# Patient Record
Sex: Male | Born: 1965 | Race: White | Hispanic: No | Marital: Married | Smoking: Current every day smoker
Health system: Southern US, Community
[De-identification: ages and names within clinical notes are randomized; demographics above are authoritative.]

## PROBLEM LIST (undated history)

## (undated) DIAGNOSIS — E039 Hypothyroidism, unspecified: Secondary | ICD-10-CM

---

## 2004-07-26 ENCOUNTER — Observation Stay (HOSPITAL_COMMUNITY): Admission: EM | Admit: 2004-07-26 | Discharge: 2004-07-26 | Payer: Self-pay | Admitting: Emergency Medicine

## 2009-01-09 ENCOUNTER — Ambulatory Visit: Payer: Self-pay | Admitting: Specialist

## 2009-01-12 ENCOUNTER — Ambulatory Visit: Payer: Self-pay | Admitting: Specialist

## 2020-06-13 ENCOUNTER — Ambulatory Visit
Admission: EM | Admit: 2020-06-13 | Discharge: 2020-06-13 | Disposition: A | Discharge status: Home / Self Care | Payer: BC Managed Care – PPO

## 2020-06-13 ENCOUNTER — Other Ambulatory Visit: Payer: Self-pay

## 2020-06-13 ENCOUNTER — Encounter: Payer: Self-pay | Admitting: Emergency Medicine

## 2020-06-13 DIAGNOSIS — H00014 Hordeolum externum left upper eyelid: Secondary | ICD-10-CM | POA: Diagnosis not present

## 2020-06-13 HISTORY — DX: Hypothyroidism, unspecified: E03.9

## 2020-06-13 MED ORDER — PREDNISOLONE ACETATE 1 % OP SUSP: 1.0000 [drp] | Freq: Four times a day (QID) | OPHTHALMIC | 0 refills | Status: DC

## 2020-06-13 MED ORDER — POLYMYXIN B-TRIMETHOPRIM 10000-0.1 UNIT/ML-% OP SOLN: 1.0000 [drp] | OPHTHALMIC | 0 refills | Status: AC

## 2020-06-13 NOTE — ED Triage Notes (Signed)
No changes in vision.

## 2020-06-13 NOTE — Discharge Instructions (Addendum)
Continue warm compresses at home.  Soak a wash cloth in warm (not scalding) water and place it over the eyes. As the wash cloth cools, it should be rewarmed and replaced for a total of 5 to 10 minutes of soaking time. Warm compresses should be applied two to four times a day as long as the patient has symptoms Perform lid washing: Either warm water or very dilute baby shampoo can be placed on a clean wash cloth, gauze pad, or cotton swab. Then be advised to gently clean along the lashes and lid margin to remove the accumulated material with care to avoid contacting the ocular surface. If shampoo is used, thorough rinsing is recommended. Vigorous washing should be avoided, as it may cause more irritation.  Prescribed ophthalmic antibiotic.  Take as prescribed Follow up with ophthalmology for further evaluation and management if symptoms persists Return or go to ER if you have any new or worsening symptoms such as fever, chills, redness, swelling, eye pain, painful eye movements, vision changes, etc..Marland Kitchen

## 2020-06-13 NOTE — ED Provider Notes (Signed)
[facility]   [id number] 06/13/20 Arrival Time: 6256  Chief Complaint  Patient presents with  . Belepharitis    left     SUBJECTIVE:  Wayne Wells is a 54 y.o. male who presents to the urgent care for complaint of left eye swelling that started last night.  Denies a precipitating event, trauma, or close contacts with similar symptoms.  Has tried OTC eye drops without relief.  Denies alleviating or aggravating factors.  Denies similar symptoms in the past.  Denies fever, chills, nausea, vomiting, eye pain, painful eye movements, halos, discharge, itching, vision changes, double vision, FB sensation, periorbital erythema.     Denies contact lens use.    ROS: As per HPI.  All other pertinent ROS negative.     Past Medical History:  Diagnosis Date  . Hypothyroidism    History reviewed. No pertinent surgical history. No Known Allergies No current facility-administered medications on file prior to encounter.   Current Outpatient Medications on File Prior to Encounter  Medication Sig Dispense Refill  . levothyroxine (SYNTHROID) 50 MCG tablet Take 50 mcg by mouth daily.     Social History   Socioeconomic History  . Marital status: Married    Spouse name: Not on file  . Number of children: Not on file  . Years of education: Not on file  . Highest education level: Not on file  Occupational History  . Not on file  Tobacco Use  . Smoking status: Current Every Day Smoker  . Smokeless tobacco: Never Used  Vaping Use  . Vaping Use: Never used  Substance and Sexual Activity  . Alcohol use: Yes  . Drug use: Never  . Sexual activity: Not on file  Other Topics Concern  . Not on file  Social History Narrative  . Not on file   Social Determinants of Health   Financial Resource Strain:   . Difficulty of Paying Living Expenses:   Food Insecurity:   . Worried About Charity fundraiser in the Last Year:   . Arboriculturist in the Last Year:   Transportation Needs:     . Film/video editor (Medical):   Marland Kitchen Lack of Transportation (Non-Medical):   Physical Activity:   . Days of Exercise per Week:   . Minutes of Exercise per Session:   Stress:   . Feeling of Stress :   Social Connections:   . Frequency of Communication with Friends and Family:   . Frequency of Social Gatherings with Friends and Family:   . Attends Religious Services:   . Active Member of Clubs or Organizations:   . Attends Archivist Meetings:   Marland Kitchen Marital Status:   Intimate Partner Violence:   . Fear of Current or Ex-Partner:   . Emotionally Abused:   Marland Kitchen Physically Abused:   . Sexually Abused:    No family history on file.  OBJECTIVE:    Visual Acuity  Right Eye Distance:   Left Eye Distance:   Bilateral Distance:    Right Eye Near:   Left Eye Near:    Bilateral Near:      Vitals:   06/13/20 1634  BP: 136/88  Pulse: 88  Resp: 16  Temp: 98 F (36.7 C)  TempSrc: Oral  SpO2: 97%  Weight: 175 lb (79.4 kg)    Physical Exam Vitals and nursing note reviewed.  Constitutional:      General: He is not in acute distress.    Appearance:  Normal appearance. He is normal weight. He is not ill-appearing, toxic-appearing or diaphoretic.  Eyes:     General: Lids are normal. Lids are everted, no foreign bodies appreciated. Vision grossly intact. Gaze aligned appropriately. No visual field deficit.       Right eye: No foreign body, discharge or hordeolum.        Left eye: Hordeolum present.No foreign body or discharge.     Extraocular Movements: Extraocular movements intact.  Cardiovascular:     Rate and Rhythm: Normal rate and regular rhythm.     Pulses: Normal pulses.     Heart sounds: Normal heart sounds. No murmur heard.  No friction rub. No gallop.   Pulmonary:     Effort: Pulmonary effort is normal. No respiratory distress.     Breath sounds: Normal breath sounds. No stridor. No wheezing, rhonchi or rales.  Chest:     Chest wall: No tenderness.   Neurological:     Mental Status: He is alert and oriented to person, place, and time.      ASSESSMENT & PLAN:  1. Hordeolum externum of left upper eyelid     Meds ordered this encounter  Medications  . trimethoprim-polymyxin b (POLYTRIM) ophthalmic solution    Sig: Place 1 drop into the left eye every 4 (four) hours for 10 days.    Dispense:  10 mL    Refill:  0  . DISCONTD: prednisoLONE acetate (PRED FORTE) 1 % ophthalmic suspension    Sig: Place 1 drop into the left eye 4 (four) times daily.    Dispense:  5 mL    Refill:  0     Discharge Instructions Continue warm compresses at home.  Soak a wash cloth in warm (not scalding) water and place it over the eyes. As the wash cloth cools, it should be rewarmed and replaced for a total of 5 to 10 minutes of soaking time. Warm compresses should be applied two to four times a day as long as the patient has symptoms Perform lid washing: Either warm water or very dilute baby shampoo can be placed on a clean wash cloth, gauze pad, or cotton swab. Then be advised to gently clean along the lashes and lid margin to remove the accumulated material with care to avoid contacting the ocular surface. If shampoo is used, thorough rinsing is recommended. Vigorous washing should be avoided, as it may cause more irritation.  Prescribed ophthalmic antibiotic.  Take as prescribed Follow up with ophthalmology for further evaluation and management if symptoms persists Return or go to ER if you have any new or worsening symptoms such as fever, chills, redness, swelling, eye pain, painful eye movements, vision changes, etc...  Reviewed expectations re: course of current medical issues. Questions answered. Outlined signs and symptoms indicating need for more acute intervention. Patient verbalized understanding. After Visit Summary given.   Emerson Monte, [place] 06/13/20 [phone]

## 2020-06-13 NOTE — ED Triage Notes (Signed)
Last night left eye started swelling.  Mainly eyelid.

## 2021-01-14 ENCOUNTER — Other Ambulatory Visit: Payer: Self-pay

## 2021-01-14 ENCOUNTER — Encounter: Payer: Self-pay | Admitting: Dermatology

## 2021-01-14 ENCOUNTER — Ambulatory Visit: Payer: BC Managed Care – PPO | Admitting: Dermatology

## 2021-01-14 DIAGNOSIS — L820 Inflamed seborrheic keratosis: Secondary | ICD-10-CM | POA: Diagnosis not present

## 2021-01-14 DIAGNOSIS — D485 Neoplasm of uncertain behavior of skin: Secondary | ICD-10-CM

## 2021-01-14 DIAGNOSIS — L720 Epidermal cyst: Secondary | ICD-10-CM | POA: Diagnosis not present

## 2021-01-14 NOTE — Patient Instructions (Signed)

## 2021-01-20 ENCOUNTER — Encounter: Payer: Self-pay | Admitting: Dermatology

## 2021-01-20 NOTE — Progress Notes (Signed)
   Follow-Up Visit   Subjective  Wayne Wells is a 55 y.o. male who presents for the following: Skin Problem (Mole on scalp x years- seems to be getting rough & cyst on back x 4).  Change in mole on scalp Location:  Duration:  Quality:  Associated Signs/Symptoms: Modifying Factors:  Severity:  Timing: Context: Also on several other lesions examined including cysts.  Objective  Well appearing patient in no apparent distress; mood and affect are within normal limits. Objective  Mid Parietal Scalp: Waxy pink 6 mm papule.     Objective  Left Hip, Right Thigh - Anterior, Right Upper Back: 0.6 to 1.5 cm deep dermal to subcutaneous none inflamed lesions    All skin waist up examined.  Plus legs.   Assessment & Plan    Neoplasm of uncertain behavior of skin Mid Parietal Scalp  Skin / nail biopsy Type of biopsy: tangential   Informed consent: discussed and consent obtained   Timeout: patient name, date of birth, surgical site, and procedure verified   Procedure prep:  Patient was prepped and draped in usual sterile fashion (Non sterile) Prep type:  Chlorhexidine Anesthesia: the lesion was anesthetized in a standard fashion   Anesthetic:  1% lidocaine w/ epinephrine 1-100,000 local infiltration Instrument used: flexible razor blade   Outcome: patient tolerated procedure well   Post-procedure details: wound care instructions given    Destruction of lesion Complexity: simple   Destruction method: electrodesiccation and curettage   Informed consent: discussed and consent obtained   Timeout:  patient name, date of birth, surgical site, and procedure verified Anesthesia: the lesion was anesthetized in a standard fashion   Anesthetic:  1% lidocaine w/ epinephrine 1-100,000 local infiltration Curettage performed in three different directions: Yes   Curettage cycles:  3 Margin per side (cm):  0.1 Final wound size (cm):  0.8 Hemostasis achieved with:  aluminum  chloride Outcome: patient tolerated procedure well with no complications   Post-procedure details: wound care instructions given    Specimen 1 - Surgical pathology Differential Diagnosis: bcc vs scc, wart  Check Margins: No  After shave biopsy base was treated with curettage and cautery  Epidermal cyst (3) Left Hip; Right Thigh - Anterior; Right Upper Back  Benign nature along with possibility of growth and inflammation detailed.  Patient will think over whether he wants to schedule surgical removal.      I, Lavonna Monarch, MD, have reviewed all documentation for this visit.  The documentation on 01/20/21 for the exam, diagnosis, procedures, and orders are all accurate and complete.

## 2021-03-14 ENCOUNTER — Other Ambulatory Visit: Payer: Self-pay

## 2021-03-14 ENCOUNTER — Encounter: Payer: Self-pay | Admitting: Dermatology

## 2021-03-14 ENCOUNTER — Ambulatory Visit (INDEPENDENT_AMBULATORY_CARE_PROVIDER_SITE_OTHER): Payer: BC Managed Care – PPO | Admitting: Dermatology

## 2021-03-14 DIAGNOSIS — B070 Plantar wart: Secondary | ICD-10-CM | POA: Diagnosis not present

## 2021-03-14 DIAGNOSIS — D485 Neoplasm of uncertain behavior of skin: Secondary | ICD-10-CM

## 2021-03-14 DIAGNOSIS — L720 Epidermal cyst: Secondary | ICD-10-CM | POA: Diagnosis not present

## 2021-03-14 NOTE — Patient Instructions (Signed)

## 2021-03-24 ENCOUNTER — Encounter: Payer: Self-pay | Admitting: Dermatology

## 2021-07-01 NOTE — Progress Notes (Signed)
   Follow-Up Visit   Subjective  Wayne Wells is a 55 y.o. male who presents for the following: Cyst (Left hip).  This done left upper hip which patient wants removed.  Also check bottom of foot Location:  Duration:  Quality:  Associated Signs/Symptoms: Modifying Factors:  Severity:  Timing: Context:   Objective  Well appearing patient in no apparent distress; mood and affect are within normal limits. Left 2nd Metatarsal Plantar Area Slightly tender hyperkeratotic 6 mm lesion  Left Hip Plus centimeter none inflamed mobile subcutaneous nodule compatible with epidermoid cyst.  Patient requests removal of the lesion and understands the risks of scar, recurrence, infection, noncoverage by insurance.    A focused examination was performed including torso, legs, feet.. Relevant physical exam findings are noted in the Assessment and Plan.   Assessment & Plan    Plantar wart Left 2nd Metatarsal Plantar Area  Pare & in office [facility].  Explained the primary goal of treatment is to relieve/prevent pain.  Epidermoid cyst Left Hip  Skin excision - Left Hip  Lesion length (cm):  3 Lesion width (cm):  0.8 Margin per side (cm):  0 Total excision diameter (cm):  3 Informed consent: discussed and consent obtained   Timeout: patient name, date of birth, surgical site, and procedure verified   Anesthesia: the lesion was anesthetized in a standard fashion   Anesthetic:  1% lidocaine w/ epinephrine 1-100,000 local infiltration Instrument used: #15 blade   Hemostasis achieved with: pressure and electrodesiccation   Outcome: patient tolerated procedure well with no complications   Post-procedure details: sterile dressing applied and wound care instructions given   Dressing type: bandage, petrolatum and pressure dressing   Additional details:  3-0 vicryl x 3 4-0 Ethilon x 7  Specimen 1 - Surgical pathology Differential Diagnosis: r/o cyst  Check Margins: No      I, Lavonna Monarch, MD, have reviewed all documentation for this visit.  The documentation on 07/01/21 for the exam, diagnosis, procedures, and orders are all accurate and complete.

## 2022-01-06 ENCOUNTER — Ambulatory Visit
Admission: EM | Admit: 2022-01-06 | Discharge: 2022-01-06 | Disposition: A | Discharge status: Home / Self Care | Payer: BC Managed Care – PPO | Attending: Urgent Care | Admitting: Urgent Care

## 2022-01-06 ENCOUNTER — Emergency Department (HOSPITAL_COMMUNITY)
Admission: EM | Admit: 2022-01-06 | Discharge: 2022-01-06 | Disposition: A | Discharge status: Home / Self Care | Payer: BC Managed Care – PPO | Attending: Emergency Medicine | Admitting: Emergency Medicine

## 2022-01-06 ENCOUNTER — Emergency Department (HOSPITAL_COMMUNITY): Payer: BC Managed Care – PPO

## 2022-01-06 ENCOUNTER — Encounter (HOSPITAL_COMMUNITY): Payer: Self-pay

## 2022-01-06 ENCOUNTER — Other Ambulatory Visit: Payer: Self-pay

## 2022-01-06 DIAGNOSIS — F17200 Nicotine dependence, unspecified, uncomplicated: Secondary | ICD-10-CM

## 2022-01-06 DIAGNOSIS — R079 Chest pain, unspecified: Secondary | ICD-10-CM

## 2022-01-06 DIAGNOSIS — R0781 Pleurodynia: Secondary | ICD-10-CM | POA: Insufficient documentation

## 2022-01-06 LAB — CBC
HCT: 46.5 % (ref 39.0–52.0)
Hemoglobin: 15.7 g/dL (ref 13.0–17.0)
MCH: 30.2 pg (ref 26.0–34.0)
MCHC: 33.8 g/dL (ref 30.0–36.0)
MCV: 89.4 fL (ref 80.0–100.0)
Platelets: 186 10*3/uL (ref 150–400)
RBC: 5.2 MIL/uL (ref 4.22–5.81)
RDW: 12.9 % (ref 11.5–15.5)
WBC: 5.6 10*3/uL (ref 4.0–10.5)
nRBC: 0 % (ref 0.0–0.2)

## 2022-01-06 LAB — BASIC METABOLIC PANEL
Anion gap: 8 (ref 5–15)
BUN: 12 mg/dL (ref 6–20)
CO2: 26 mmol/L (ref 22–32)
Calcium: 9.3 mg/dL (ref 8.9–10.3)
Chloride: 105 mmol/L (ref 98–111)
Creatinine, Ser: 1.07 mg/dL (ref 0.61–1.24)
GFR, Estimated: >60 mL/min (ref >60)
Glucose, Bld: 95 mg/dL (ref 70–99)
Potassium: 4.1 mmol/L (ref 3.5–5.1)
Sodium: 139 mmol/L (ref 135–145)

## 2022-01-06 LAB — D-DIMER, QUANTITATIVE: D-Dimer, Quant: <0.27 ug/mL-FEU (ref 0.00–0.50)

## 2022-01-06 LAB — TROPONIN I (HIGH SENSITIVITY)
Troponin I (High Sensitivity): 4 ng/L (ref <18)
Troponin I (High Sensitivity): 4 ng/L (ref <18)

## 2022-01-06 NOTE — ED Triage Notes (Addendum)
Pt reports left sided chest pain since this morning; tingling finger left hand x 1 week. Chest pain is worse with cough or deep breath. Pt took aspirin 324 mg around 9:30 today.

## 2022-01-06 NOTE — ED Provider Notes (Signed)
[place] EMERGENCY DEPARTMENT Provider Note   CSN: [id number] Arrival date & time: 01/06/22  1104     History  Chief Complaint  Patient presents with   Chest Pain    Wayne Wells is a 56 y.o. male.   Chest Pain  HPI: A 56 year old patient presents for evaluation of chest pain. Initial onset of pain was approximately 3-6 hours ago. The patient's chest pain is sharp and is not worse with exertion. The patient's chest pain is middle- or left-sided, is not well-localized, is not described as heaviness/pressure/tightness and does radiate to the arms/jaw/neck. The patient does not complain of nausea and denies diaphoresis. The patient has smoked in the past 90 days. The patient has no history of stroke, has no history of peripheral artery disease, denies any history of treated diabetes, has no relevant family history of coronary artery disease (first degree relative at less than age 49), is not hypertensive, has no history of hypercholesterolemia and does not have an elevated BMI (>=30).  Pain also increases with inspiration.  Patient was seen at an urgent care care today.  I reviewed the notes of that visit.  He was sent to the ED for further evaluation Home Medications Prior to Admission medications   Medication Sig Start Date End Date Taking? Authorizing Provider  levothyroxine (SYNTHROID) 50 MCG tablet Take 50 mcg by mouth daily. 05/04/20   [provider]      Allergies    Patient has no known allergies.    Review of Systems   Review of Systems  Cardiovascular:  Positive for chest pain.  All other systems reviewed and are negative.  Physical Exam Updated Vital Signs BP (!) 153/87    Pulse 94    Temp 98.1 F (36.7 C) (Oral)    Resp 15    Ht 1.727 m (5\' 8" )    Wt 81.6 kg    SpO2 98%    BMI 27.37 kg/m  Physical Exam Vitals and nursing note reviewed.  Constitutional:      General: He is not in acute distress.    Appearance: He is well-developed.   HENT:     Head: Normocephalic and atraumatic.     Right Ear: External ear normal.     Left Ear: External ear normal.  Eyes:     General: No scleral icterus.       Right eye: No discharge.        Left eye: No discharge.     Conjunctiva/sclera: Conjunctivae normal.  Neck:     Trachea: No tracheal deviation.  Cardiovascular:     Rate and Rhythm: Normal rate and regular rhythm.  Pulmonary:     Effort: Pulmonary effort is normal. No respiratory distress.     Breath sounds: Normal breath sounds. No stridor. No wheezing or rales.  Abdominal:     General: Bowel sounds are normal. There is no distension.     Palpations: Abdomen is soft.     Tenderness: There is no abdominal tenderness. There is no guarding or rebound.  Musculoskeletal:        General: No tenderness or deformity.     Cervical back: Neck supple.  Skin:    General: Skin is warm and dry.     Findings: No rash.  Neurological:     General: No focal deficit present.     Mental Status: He is alert.     Cranial Nerves: No cranial nerve deficit (no facial droop, extraocular  movements intact, no slurred speech).     Sensory: No sensory deficit.     Motor: No abnormal muscle tone or seizure activity.     Coordination: Coordination normal.  Psychiatric:        Mood and Affect: Mood normal.    ED Results / Procedures / Treatments   Labs (all labs ordered are listed, but only abnormal results are displayed) Labs Reviewed  BASIC METABOLIC PANEL  CBC  D-DIMER, QUANTITATIVE  TROPONIN I (HIGH SENSITIVITY)  TROPONIN I (HIGH SENSITIVITY)    EKG EKG Interpretation  Date/Time:  Monday January 06 2022 11:28:26 EST Ventricular Rate:  72 PR Interval:  150 QRS Duration: 90 QT Interval:  382 QTC Calculation: 418 R Axis:   51 Text Interpretation: Normal sinus rhythm Normal ECG When compared with ECG of 06-Jan-2022 10:21, PREVIOUS ECG IS PRESENT No significant change since last tracing Confirmed by Dorie Rank [phone]) on  01/06/2022 12:41:59 PM  Radiology DG Chest 2 View  Result Date: 01/06/2022 CLINICAL DATA:  LEFT side chest pain since this morning, tingling in fingers of LEFT hand for 1 week, worsened chest pain with coughing and deep breathing EXAM: CHEST - 2 VIEW COMPARISON:  None FINDINGS: Normal heart size, mediastinal contours, and pulmonary vascularity. Lungs clear. No pleural effusion or pneumothorax. Bones unremarkable. IMPRESSION: No acute abnormalities. Electronically Signed   By: Lavonia Dana M.D.   On: 01/06/2022 12:12    Procedures Procedures    Medications Ordered in ED Medications - No data to display  ED Course/ Medical Decision Making/ A&P Clinical Course as of 01/06/22 1610  Mon Jan 06, 2022  0601 Basic metabolic panel Metabolic panel normal [JK]  1545 CBC CBC normal [JK]  1545 Troponin I (High Sensitivity) Troponin normal [JK]  1545 D-dimer, quantitative D-dimer negative [JK]  1545 DG Chest 2 View Chest x-ray without acute findings [JK]    Clinical Course User Index [JK] Dorie Rank, MD   HEAR Score: 2                       Medical Decision Making Amount and/or Complexity of Data Reviewed Labs: ordered. Decision-making details documented in ED Course. Radiology: ordered. Decision-making details documented in ED Course.   Low risk heart score.  Symptoms atypical for acute coronary syndrome.  Serial troponins normal.  EKG reassuring.  Patient is having pleuritic discomfort.  D-dimer negative.  Doubt PE.  Do not feel that admission to the hospital for further cardiac evaluation is necessary.  Recommend outpatient follow-up with PCP.  Discussed outpatient stress testing.  Warning signs precautions discussed.        Final Clinical Impression(s) / ED Diagnoses Final diagnoses:  Chest pain, unspecified type    Rx / DC Orders ED Discharge Orders     None         Dorie Rank, MD 01/06/22 1610

## 2022-01-06 NOTE — ED Triage Notes (Signed)
Patient complains of left sided chest pain that he describes as worse with pain and inspiration. Non-radiating and a few days ago experienced tingling in arm

## 2022-01-06 NOTE — ED Provider Notes (Signed)
[place]   MRN: [id number] DOB: 25-Mar-1966  Subjective:   Wayne Wells is a 56 y.o. male presenting for acute onset of left-sided chest pain this morning.  Patient states that he took some aspirin prior to coming in.  Currently the pain is mild to moderate, rated 3-4 out of 10.  Has also had some tingling in his finger of the left hand.  Chest pain is worse with taking a deep breath and coughing.  He is a smoker, does 3/4ppd.  He does have a history of hypothyroidism.  No drug use.  No history of heart disease.  No current facility-administered medications for this encounter.  Current Outpatient Medications:    levothyroxine (SYNTHROID) 50 MCG tablet, Take 50 mcg by mouth daily., Disp: , Rfl:    No Known Allergies  Past Medical History:  Diagnosis Date   Hypothyroidism      History reviewed. No pertinent surgical history.  History reviewed. No pertinent family history.  Social History   Tobacco Use   Smoking status: Every Day   Smokeless tobacco: Never  Vaping Use   Vaping Use: Never used  Substance Use Topics   Alcohol use: Yes   Drug use: Never    ROS   Objective:   Vitals: BP (!) 144/83 (BP Location: Right Arm)    Pulse 62    Temp 97.7 F (36.5 C) (Oral)    Resp 17    SpO2 98%   Physical Exam Constitutional:      General: He is not in acute distress.    Appearance: Normal appearance. He is well-developed. He is not ill-appearing, toxic-appearing or diaphoretic.  HENT:     Head: Normocephalic and atraumatic.     Right Ear: External ear normal.     Left Ear: External ear normal.     Nose: Nose normal.     Mouth/Throat:     Mouth: Mucous membranes are moist.  Eyes:     General: No scleral icterus.       Right eye: No discharge.        Left eye: No discharge.     Extraocular Movements: Extraocular movements intact.  Cardiovascular:     Rate and Rhythm: Normal rate and regular rhythm.     Heart sounds: Normal heart sounds. No murmur  heard.   No friction rub. No gallop.  Pulmonary:     Effort: Pulmonary effort is normal. No respiratory distress.     Breath sounds: Normal breath sounds. No stridor. No wheezing, rhonchi or rales.  Chest:     Chest wall: No tenderness.  Neurological:     Mental Status: He is alert and oriented to person, place, and time.  Psychiatric:        Mood and Affect: Mood normal.        Behavior: Behavior normal.        Thought Content: Thought content normal.   ED ECG REPORT   Date: 01/06/2022  EKG Time: 10:42 AM  Rate: 67bpm  Rhythm: normal sinus rhythm,  there are no previous tracings available for comparison  Axis: normal  Intervals:none  ST&T Change: T wave flattening in lead aVL  Narrative Interpretation: Sinus rhythm at 67 bpm with nonspecific T wave flattening in lead aVL.  No previous EKG available for comparison.   Assessment and Plan :   PDMP not reviewed this encounter.  1. Left-sided chest pain   2. Smoker    I discussed with patient about the  possibility of ACS.  He understands the urgency of needing this worked up and the life-threatening nature of ACS.  Presents with his wife, who contracts for safety and will take him to the hospital now.  As there are no signs of current ACS on his EKG I was agreeable to defer transport to the hospital by EMS.   Jaynee Eagles, PA-C 01/06/22 1051

## 2022-01-06 NOTE — Discharge Instructions (Signed)
Try taking over-the-counter medication such as ibuprofen or Naprosyn over the next few days.  Follow-up with your primary care doctor to discuss further evaluation such as a stress test.  Return to the ED as needed for worsening symptoms.

## 2022-01-06 NOTE — ED Notes (Signed)
Pt took 324 mg aspirin. Pt cp pain was at a 5 but now it is barely felt. Pt had numbness in his right hand. Pt usually have numbness in the left hand.

## 2022-01-06 NOTE — Discharge Instructions (Addendum)
Please present to the hospital now for further testing of your heart to make sure you are not having a heart event.

## 2022-03-06 ENCOUNTER — Emergency Department (HOSPITAL_COMMUNITY): Payer: BC Managed Care – PPO

## 2022-03-06 ENCOUNTER — Emergency Department (HOSPITAL_COMMUNITY)
Admission: EM | Admit: 2022-03-06 | Discharge: 2022-03-06 | Disposition: A | Discharge status: Home / Self Care | Payer: BC Managed Care – PPO | Attending: Emergency Medicine | Admitting: Emergency Medicine

## 2022-03-06 ENCOUNTER — Other Ambulatory Visit: Payer: Self-pay

## 2022-03-06 DIAGNOSIS — E039 Hypothyroidism, unspecified: Secondary | ICD-10-CM | POA: Diagnosis not present

## 2022-03-06 DIAGNOSIS — R0789 Other chest pain: Secondary | ICD-10-CM | POA: Insufficient documentation

## 2022-03-06 DIAGNOSIS — F17200 Nicotine dependence, unspecified, uncomplicated: Secondary | ICD-10-CM | POA: Diagnosis not present

## 2022-03-06 DIAGNOSIS — R079 Chest pain, unspecified: Secondary | ICD-10-CM

## 2022-03-06 DIAGNOSIS — Z79899 Other long term (current) drug therapy: Secondary | ICD-10-CM | POA: Diagnosis not present

## 2022-03-06 DIAGNOSIS — Z7982 Long term (current) use of aspirin: Secondary | ICD-10-CM | POA: Insufficient documentation

## 2022-03-06 LAB — BASIC METABOLIC PANEL
Anion gap: 4 — ABNORMAL LOW (ref 5–15)
BUN: 16 mg/dL (ref 6–20)
CO2: 26 mmol/L (ref 22–32)
Calcium: 9 mg/dL (ref 8.9–10.3)
Chloride: 109 mmol/L (ref 98–111)
Creatinine, Ser: 1.09 mg/dL (ref 0.61–1.24)
GFR, Estimated: >60 mL/min (ref >60)
Glucose, Bld: 105 mg/dL — ABNORMAL HIGH (ref 70–99)
Potassium: 4 mmol/L (ref 3.5–5.1)
Sodium: 139 mmol/L (ref 135–145)

## 2022-03-06 LAB — CBC
HCT: 45.3 % (ref 39.0–52.0)
Hemoglobin: 14.9 g/dL (ref 13.0–17.0)
MCH: 29.9 pg (ref 26.0–34.0)
MCHC: 32.9 g/dL (ref 30.0–36.0)
MCV: 91 fL (ref 80.0–100.0)
Platelets: 176 10*3/uL (ref 150–400)
RBC: 4.98 MIL/uL (ref 4.22–5.81)
RDW: 13.2 % (ref 11.5–15.5)
WBC: 5.4 10*3/uL (ref 4.0–10.5)
nRBC: 0 % (ref 0.0–0.2)

## 2022-03-06 LAB — TROPONIN I (HIGH SENSITIVITY)
Troponin I (High Sensitivity): 5 ng/L (ref <18)
Troponin I (High Sensitivity): 4 ng/L (ref <18)

## 2022-03-06 MED ORDER — CYCLOBENZAPRINE HCL 10 MG PO TABS: 10.0000 mg | ORAL_TABLET | Freq: Two times a day (BID) | ORAL | 0 refills | Status: DC | PRN

## 2022-03-06 MED ORDER — ASPIRIN 81 MG PO CHEW
162.0000 mg | CHEWABLE_TABLET | Freq: Once | ORAL | Status: AC
  Administered 2022-03-06: 162 mg via ORAL
  Filled 2022-03-06: qty 2

## 2022-03-06 NOTE — ED Notes (Signed)
ED Provider at bedside. 

## 2022-03-06 NOTE — Discharge Instructions (Addendum)
You have been evaluated for your chest pain.  Fortunately your labs, EKG, and chest x-ray did not show any concerning finding.  You may take aspirin or Advil as needed for your chest pain.  You may take muscle relaxant prescribed.  Call and follow-up closely with your primary care doctor for further care.  Return if you have any concern ?

## 2022-03-06 NOTE — ED Provider Notes (Signed)
?[facility] ?Provider Note ? ? ?CSN: [id number] ?Arrival date & time: 03/06/22  0356 ? ?  ? ?History ? ?Chief Complaint  ?Patient presents with  ? Chest Pain  ? ? ?Wayne Wells is a 56 y.o. male. ? ?The history is provided by the patient and medical records. No language interpreter was used.  ?Chest Pain ? ?56 year old male significant history of hypothyroidism, presented to ED with complaints of chest pain.  Patient endorses the past 2 weeks he has had intermittent chest pain.  He reports pain is described as an uncomfortable sensation to the right side of his chest as well as having some pain down his right arm.  He noticed more pain when he tries to perform bicep curls but not so much when he perform bench press.  Pain is waxing waning not associated with lightheadedness, dizziness, nausea, shortness of breath.  Since being in the ER waiting to get into the room, his right arm pain has resolved but he still endorsed discomfort in his chest mild in severity.  He admits to occasional tobacco use and social drinker.  He denies any significant cardiac history.  He denies any strong family history of cardiac disease.  Is tried taking Aleve with some improvement.  No prior history of PE DVT no recent surgery prolonged bedrest active cancer or hemoptysis. ? ?Home Medications ?Prior to Admission medications   ?Medication Sig Start Date End Date Taking? Authorizing Provider  ?aspirin EC 81 MG tablet Take 162 mg by mouth once. Swallow whole.   Yes [provider]  ?levothyroxine (SYNTHROID) 50 MCG tablet Take 50 mcg by mouth daily before breakfast. 05/04/20  Yes [provider]  ?naproxen sodium (ALEVE) 220 MG tablet Take 440 mg by mouth daily as needed (pain).   Yes [provider]  ?   ? ?Allergies    ?Patient has no known allergies.   ? ?Review of Systems   ?Review of Systems  ?Cardiovascular:  Positive for chest pain.  ?All other systems reviewed and are  negative. ? ?Physical Exam ?Updated Vital Signs ?BP (!) 146/78 (BP Location: Left Arm)   Pulse 65   Temp 97.6 ?F (36.4 ?C) (Oral)   Resp 18   SpO2 100%  ?Physical Exam ?Vitals and nursing note reviewed.  ?Constitutional:   ?   General: He is not in acute distress. ?   Appearance: He is well-developed.  ?HENT:  ?   Head: Atraumatic.  ?Eyes:  ?   Conjunctiva/sclera: Conjunctivae normal.  ?Cardiovascular:  ?   Rate and Rhythm: Normal rate and regular rhythm.  ?   Pulses: Normal pulses.  ?   Heart sounds: Normal heart sounds.  ?Pulmonary:  ?   Effort: Pulmonary effort is normal.  ?   Breath sounds: Normal breath sounds. No wheezing, rhonchi or rales.  ?Chest:  ?   Chest wall: No tenderness.  ?Abdominal:  ?   Palpations: Abdomen is soft.  ?   Tenderness: There is no abdominal tenderness.  ?Musculoskeletal:  ?   Cervical back: Neck supple.  ?   Right lower leg: No edema.  ?   Left lower leg: No edema.  ?Skin: ?   Findings: No rash.  ?Neurological:  ?   Mental Status: He is alert.  ?Psychiatric:     ?   Mood and Affect: Mood normal.  ? ? ?ED Results / Procedures / Treatments   ?Labs ?(all labs ordered are listed, but only  abnormal results are displayed) ?Labs Reviewed  ?BASIC METABOLIC PANEL - Abnormal; Notable for the following components:  ?    Result Value  ? Glucose, Bld 105 (*)   ? Anion gap 4 (*)   ? All other components within normal limits  ?CBC  ?TROPONIN I (HIGH SENSITIVITY)  ?TROPONIN I (HIGH SENSITIVITY)  ? ? ?EKG ?None ?ED ECG REPORT ? ? Date: 03/06/2022 ? Rate: 52 ? Rhythm: sinus bradycardia ? QRS Axis: normal ? Intervals: normal ? ST/T Wave abnormalities: normal ? Conduction Disutrbances:none ? Narrative Interpretation:  ? Old EKG Reviewed: unchanged ? ?I have personally reviewed the EKG tracing and agree with the computerized printout as noted. ? ? ?Radiology ?DG Chest 2 View ? ?Result Date: 03/06/2022 ?CLINICAL DATA:  56 year old male with intermittent chest pain for 2 weeks. Right chest and shoulder  pain. Mild shortness of breath. Smoker. EXAM: CHEST - 2 VIEW COMPARISON:  Chest radiographs 01/06/2022. FINDINGS: Lung volumes and mediastinal contours remain within normal limits. Visualized tracheal air column is within normal limits. Lung markings are stable and both lungs appear clear (EKG button artifact bilaterally). No pneumothorax or pleural effusion. No acute osseous abnormality identified. Negative visible bowel gas pattern. IMPRESSION: No acute cardiopulmonary abnormality. Electronically Signed   By: Genevie Ann M.D.   On: 03/06/2022 04:32   ? ?Procedures ?Procedures  ? ? ?Medications Ordered in ED ?Medications  ?aspirin chewable tablet 162 mg (162 mg Oral Given 03/06/22 0612)  ? ? ?ED Course/ Medical Decision Making/ A&P ?  ?                        ?Medical Decision Making ? ?BP 135/80   Pulse 65   Temp 97.6 ?F (36.4 ?C) (Oral)   Resp 14   SpO2 100%  ? ?4:38 PM ?This is a 56 year old male presenting complaining of right-sided chest pain and right arm pain which has been intermittently ongoing for the past 2 weeks.  His hear score is 1, low risk of Mace.  No significant shortness of breath, low suspicion for PE.  No significant reproducible chest wall tenderness on exam.  Patient overall well-appearing, vital signs stable.  At this time recommend patient to follow-up with primary care provider for further care.  He may benefit from outpatient cardiac stress test if indicated.  Will discharge home with muscle relaxant as this pain could be musculoskeletal as patient has been working out and perform heavy lifting.  Return precaution given. ? ?This patient presents to the ED for concern of chest pain, this involves an extensive number of treatment options, and is a complaint that carries with it a high risk of complications and morbidity.  The differential diagnosis includes ACS, PE, MSK, PNA, PTX, costochondritis, muscle strain ? ?Co morbidities that complicate the patient evaluation ?hypothyrodism ? Tobacco  use ?Additional history obtained: ? ?Additional history obtained from pt ?External records from outside source obtained and reviewed including prior ER notes ? ?Lab Tests: ? ?I Ordered, and personally interpreted labs.  The pertinent results include:  reassuring labs, negative delta trop ? ?Imaging Studies ordered: ? ?I ordered imaging studies including CXR ?I independently visualized and interpreted imaging which showed no acute finding ?I agree with the radiologist interpretation ? ?Cardiac Monitoring: ? ?The patient was maintained on a cardiac monitor.  I personally viewed and interpreted the cardiac monitored which showed an underlying rhythm of: sinus bradycardia ? ? ? ?Test Considered: ?chest CTA, but low suspicion  for dissection or PE ? ?Critical Interventions: ?ASA ? ? ?Problem List / ED Course: ?chest pain ? ?Reevaluation: ? ?After the interventions noted above, I reevaluated the patient and found that they have :improved ? ?Social Determinants of Health: ?tobacco use ? ?Dispostion: ? ?After consideration of the diagnostic results and the patients response to treatment, I feel that the patent would benefit from outpt f/u. ? ? ? ? ? ? ? ? ?Final Clinical Impression(s) / ED Diagnoses ?Final diagnoses:  ?Nonspecific chest pain  ? ? ?Rx / DC Orders ?ED Discharge Orders   ? ?      Ordered  ?  cyclobenzaprine (FLEXERIL) 10 MG tablet  2 times daily PRN       ? 03/06/22 1645  ? ?  ?  ? ?  ? ? ?  ?Domenic Moras, PA-C ?03/06/22 1648 ? ?  ?Lucrezia Starch, MD ?03/07/22 1621 ? ?

## 2022-03-06 NOTE — ED Triage Notes (Signed)
Pt with intermittent chest pain x 2 weeks in addition to what he thought was tendonitis in R shoulder. This morning was woken out of sleep but pain in R chest with radiation to shoulder that is a different pain than the last 2 weeks. Endorses mild sob.  ?

## 2022-03-06 NOTE — ED Provider Triage Note (Signed)
?  Emergency Medicine Provider Triage Evaluation Note ? ?MRN:  [id number]  ?Arrival date & time: 03/06/22    ?Medically screening exam initiated at 4:12 AM.   ?CC:   ?Chest Pain ?  ?HPI:  ?Wayne Wells is a 56 y.o. year-old male presents to the ED with chief complaint of chest pain for the past few hours.  States that it is right sided and radiates to the arm.  Reports some SOB, which he says might be anxiety.  Took 2 baby aspirin PTA.  Smoking hx. ? ?History provided by patient. ?ROS:  ?-As included in HPI ?PE:  ? ?Vitals:  ? 03/06/22 0412  ?BP: (!) 166/87  ?Pulse: (!) 57  ?Resp: 17  ?Temp: 97.7 ?F (36.5 ?C)  ?SpO2: 96%  ?  ?Non-toxic appearing ?No respiratory distress ?CTAB, no palpable chest wall tenderness ?MDM:  ?Based on signs and symptoms, ACS, PE, chest wall pain is highest on my differential, followed by anxiety. ?I've ordered labs and imaging in triage to expedite lab/diagnostic workup. ? ?Patient was informed that the remainder of the evaluation will be completed by another provider, this initial triage assessment does not replace that evaluation, and the importance of remaining in the ED until their evaluation is complete. ? ?  ?Montine Circle, PA-C ?03/06/22 0414 ? ?

## 2022-03-21 ENCOUNTER — Other Ambulatory Visit: Payer: Self-pay | Admitting: Sports Medicine

## 2022-03-21 DIAGNOSIS — M5020 Other cervical disc displacement, unspecified cervical region: Secondary | ICD-10-CM

## 2022-04-02 ENCOUNTER — Ambulatory Visit
Admission: RE | Admit: 2022-04-02 | Discharge: 2022-04-02 | Disposition: A | Discharge status: Home / Self Care | Payer: BC Managed Care – PPO | Source: Ambulatory Visit | Attending: Sports Medicine | Admitting: Sports Medicine

## 2022-04-02 DIAGNOSIS — M5020 Other cervical disc displacement, unspecified cervical region: Secondary | ICD-10-CM

## 2022-04-02 MED ORDER — TRIAMCINOLONE ACETONIDE 40 MG/ML IJ SUSP (RADIOLOGY)
60.0000 mg | Freq: Once | INTRAMUSCULAR | Status: AC
  Administered 2022-04-02: 60 mg via EPIDURAL

## 2022-04-02 MED ORDER — IOPAMIDOL (ISOVUE-M 300) INJECTION 61%
1.0000 mL | Freq: Once | INTRAMUSCULAR | Status: AC
  Administered 2022-04-02: 1 mL via EPIDURAL

## 2022-04-02 NOTE — Discharge Instructions (Signed)

## 2022-12-08 IMAGING — XA DG INJECT/[PERSON_NAME] INC NEEDLE/CATH/PLC EPI/CERV/THOR W/IMG
2 series · 2 of 2 positions shown · non-contrast
Comparison: none

CLINICAL DATA: Cervical spondylosis without myelopathy. Right-sided
C6-7 disc extrusion with caudal migration. Neck and right shoulder
pain.

[Series 1: ortho standard · 1 of 1 slices shown (1 of 2)]
[im 1/1]
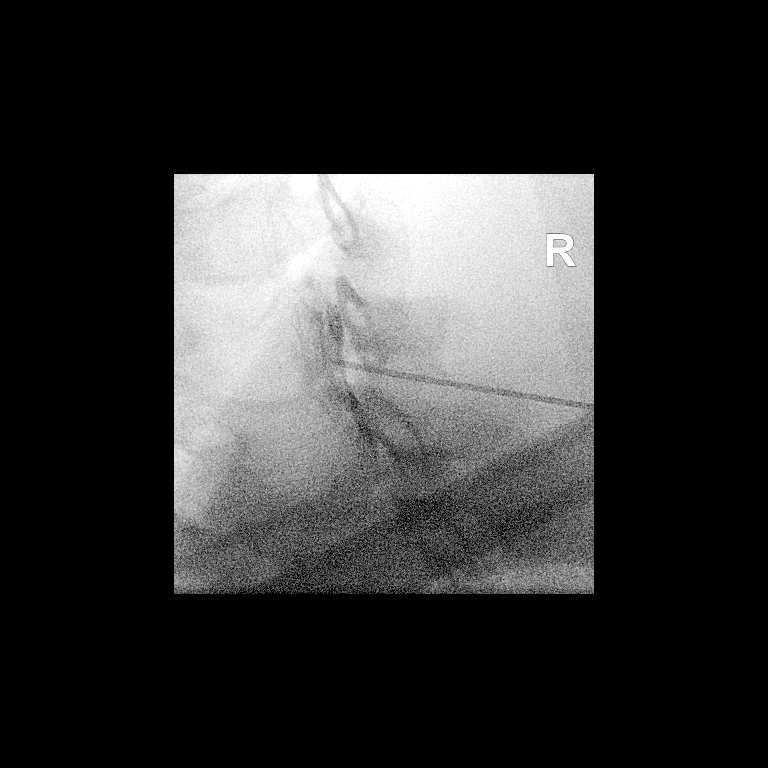

[Series 2: ortho standard · 1 of 1 slices shown (2 of 2)]
[im 1/1]
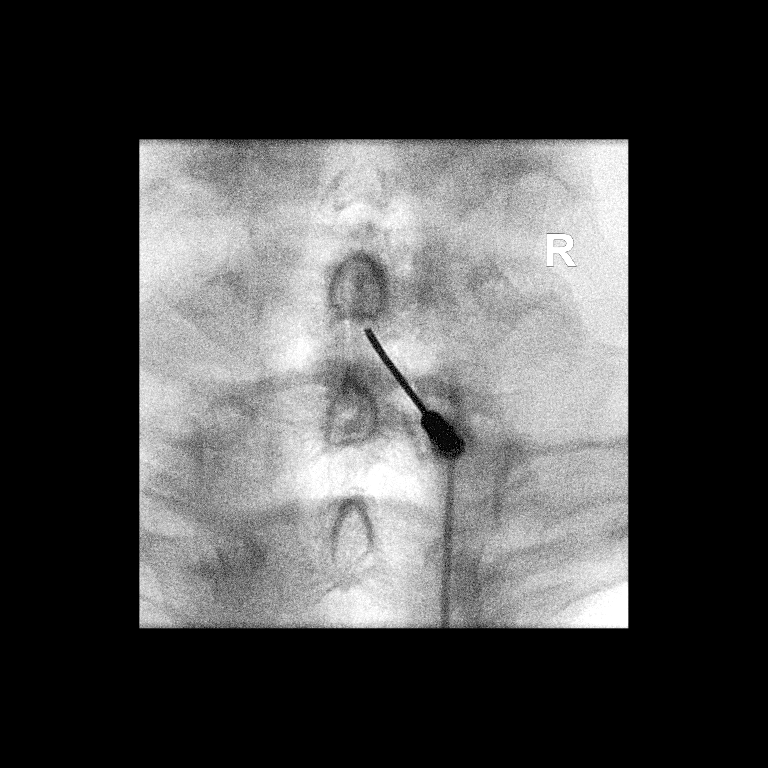

[2 of 2 positions shown; findings below may reference images not displayed]

FLUOROSCOPY:
Radiation Exposure Index (as provided by the fluoroscopic device):
1.20 mGy Kerma

PROCEDURE:
The procedure, risks, benefits, and alternatives were explained to
the patient. Questions regarding the procedure were encouraged and
answered. The patient understands and consents to the procedure.

CERVICAL EPIDURAL INJECTION

An interlaminar approach was performed on the right at C7-T1. A
inch 20 gauge epidural needle was advanced using loss-of-resistance
technique.

DIAGNOSTIC EPIDURAL INJECTION

Injection of Isovue-M 300 shows a good epidural pattern with spread
above and below the level of needle placement, primarily on the
right. No vascular opacification is seen.

THERAPEUTIC EPIDURAL INJECTION

1.5 ml of Kenalog 40 mixed with 2 ml of normal saline were then
instilled. The procedure was well-tolerated, and the patient was
discharged thirty minutes following the injection in good condition.
IMPRESSION: Technically successful interlaminar epidural injection on the right
at C7-T1.

## 2023-05-25 ENCOUNTER — Ambulatory Visit
Admission: EM | Admit: 2023-05-25 | Discharge: 2023-05-25 | Disposition: A | Discharge status: Home / Self Care | Payer: BC Managed Care – PPO | Attending: Nurse Practitioner | Admitting: Nurse Practitioner

## 2023-05-25 DIAGNOSIS — L0291 Cutaneous abscess, unspecified: Secondary | ICD-10-CM

## 2023-05-25 MED ORDER — DOXYCYCLINE HYCLATE 100 MG PO CAPS: 100.0000 mg | ORAL_CAPSULE | Freq: Two times a day (BID) | ORAL | 0 refills | Status: AC

## 2023-05-25 NOTE — ED Triage Notes (Signed)
Pt presents with boil/abscess on back of his neck that he noticed 2 days ago. Pt son drained it last night and now pt is seeing if he needs antibiotics.

## 2023-05-25 NOTE — Discharge Instructions (Signed)
Take doxycycline as prescribed to treat underlying infection Start warm compresses to help dry out infection You can take Tylenol as needed for pain

## 2023-05-25 NOTE — ED Provider Notes (Signed)
[facility]    CSN: [id number] Arrival date & time: 05/25/23  1728      History   Chief Complaint Chief Complaint  Patient presents with   Abscess    HPI Wayne Wells is a 57 y.o. male.   Patient presents today for abscess to the back of his neck that has been present for the past few days.  Reports his son stuck a needle in it and was able to drop some fluid, however the boil persists.  Patient reports it feels much better than it did yesterday.  No fevers or nausea/vomiting recently.  No active drainage.  No history of surgeries in the same area.  Patient denies antibiotic use in the past 90 days.    Past Medical History:  Diagnosis Date   Hypothyroidism     There are no problems to display for this patient.   History reviewed. No pertinent surgical history.     Home Medications    Prior to Admission medications   Medication Sig Start Date End Date Taking? Authorizing Provider  doxycycline (VIBRAMYCIN) 100 MG capsule Take 1 capsule (100 mg total) by mouth 2 (two) times daily for 7 days. 05/25/23 06/01/23 Yes Valentino Nose, NP  levothyroxine (SYNTHROID) 50 MCG tablet Take 50 mcg by mouth daily before breakfast. 05/04/20  Yes [provider]  aspirin EC 81 MG tablet Take 162 mg by mouth once. Swallow whole.    [provider]  cyclobenzaprine (FLEXERIL) 10 MG tablet Take 1 tablet (10 mg total) by mouth 2 (two) times daily as needed for muscle spasms. 03/06/22   Fayrene Helper, PA-C  naproxen sodium (ALEVE) 220 MG tablet Take 440 mg by mouth daily as needed (pain).    [provider]    Family History History reviewed. No pertinent family history.  Social History Social History   Tobacco Use   Smoking status: Every Day   Smokeless tobacco: Never  Vaping Use   Vaping Use: Never used  Substance Use Topics   Alcohol use: Yes   Drug use: Never     Allergies   Patient has no known allergies.   Review of Systems Review  of Systems Per HPI  Physical Exam Triage Vital Signs ED Triage Vitals  Enc Vitals Group     BP 05/25/23 1754 (!) 141/100     Pulse Rate 05/25/23 1754 82     Resp 05/25/23 1754 18     Temp 05/25/23 1754 97.6 F (36.4 C)     Temp Source 05/25/23 1754 Oral     SpO2 05/25/23 1754 97 %     Weight --      Height --      Head Circumference --      Peak Flow --      Pain Score 05/25/23 1757 1     Pain Loc --      Pain Edu? --      Excl. in GC? --    No data found.  Updated Vital Signs BP (!) 141/100 (BP Location: Right Arm)   Pulse 82   Temp 97.6 F (36.4 C) (Oral)   Resp 18   SpO2 97%   Visual Acuity Right Eye Distance:   Left Eye Distance:   Bilateral Distance:    Right Eye Near:   Left Eye Near:    Bilateral Near:     Physical Exam Vitals and nursing note reviewed.  Constitutional:      General:  He is not in acute distress.    Appearance: Normal appearance. He is not toxic-appearing.  HENT:     Head: Normocephalic and atraumatic.     Mouth/Throat:     Mouth: Mucous membranes are moist.  Pulmonary:     Effort: Pulmonary effort is normal. No respiratory distress.  Skin:    General: Skin is warm and dry.     Capillary Refill: Capillary refill takes less than 2 seconds.     Coloration: Skin is not jaundiced or pale.     Findings: Abscess present. No erythema or rash.          Comments: Abscess to right posterior neck approximately 2 cm x 2 cm, indurated, firm.  No fluctuance, warmth, active drainage.  Neurological:     Mental Status: He is alert and oriented to person, place, and time.  Psychiatric:        Behavior: Behavior is cooperative.      UC Treatments / Results  Labs (all labs ordered are listed, but only abnormal results are displayed) Labs Reviewed - No data to display  EKG   Radiology No results found.  Procedures Procedures (including critical care time)  Medications Ordered in UC Medications - No data to display  Initial  Impression / Assessment and Plan / UC Course  I have reviewed the triage vital signs and the nursing notes.  Pertinent labs & imaging results that were available during my care of the patient were reviewed by me and considered in my medical decision making (see chart for details).   Patient is well-appearing, afebrile, not tachycardic, not tachypneic, oxygenating well on room air.  Patient is mildly hypertensive in urgent care today.  1. Abscess Will cover with doxycycline twice daily for 7 days Supportive care discussed including warm compresses, Tylenol as needed for pain Seek care for persistent/worsening symptoms despite treatment  The patient was given the opportunity to ask questions.  All questions answered to their satisfaction.  The patient is in agreement to this plan.    Final Clinical Impressions(s) / UC Diagnoses   Final diagnoses:  Abscess     Discharge Instructions      Take doxycycline as prescribed to treat underlying infection Start warm compresses to help dry out infection You can take Tylenol as needed for pain   ED Prescriptions     Medication Sig Dispense Auth. Provider   doxycycline (VIBRAMYCIN) 100 MG capsule Take 1 capsule (100 mg total) by mouth 2 (two) times daily for 7 days. 14 capsule Valentino Nose, NP      PDMP not reviewed this encounter.   Valentino Nose, NP 05/25/23 [phone]

## 2023-07-15 ENCOUNTER — Ambulatory Visit
Admission: EM | Admit: 2023-07-15 | Discharge: 2023-07-15 | Disposition: A | Discharge status: Home / Self Care | Payer: BC Managed Care – PPO | Attending: Family Medicine | Admitting: Family Medicine

## 2023-07-15 ENCOUNTER — Other Ambulatory Visit: Payer: Self-pay

## 2023-07-15 ENCOUNTER — Encounter: Payer: Self-pay | Admitting: Emergency Medicine

## 2023-07-15 DIAGNOSIS — B9689 Other specified bacterial agents as the cause of diseases classified elsewhere: Secondary | ICD-10-CM

## 2023-07-15 DIAGNOSIS — L089 Local infection of the skin and subcutaneous tissue, unspecified: Secondary | ICD-10-CM | POA: Diagnosis not present

## 2023-07-15 DIAGNOSIS — R030 Elevated blood-pressure reading, without diagnosis of hypertension: Secondary | ICD-10-CM

## 2023-07-15 MED ORDER — DOXYCYCLINE HYCLATE 100 MG PO CAPS: 100.0000 mg | ORAL_CAPSULE | Freq: Two times a day (BID) | ORAL | 0 refills | Status: AC

## 2023-07-15 NOTE — Discharge Instructions (Signed)
Your blood pressure was noted to be elevated during your visit today. If you are currently taking medication for high blood pressure, please ensure you are taking this as directed. If you do not have a history of high blood pressure and your blood pressure remains persistently elevated, you may need to begin taking a medication at some point. You may return here within the next few days to recheck if unable to see your primary care provider or if you do not have a one.  BP (!) 162/98 (BP Location: Right Arm)   Pulse 67   Temp 98.1 F (36.7 C) (Oral)   Resp 20   SpO2 98%   BP Readings from Last 3 Encounters:  07/15/23 (!) 162/98  05/25/23 (!) 141/100  04/02/22 (!) 164/97

## 2023-07-15 NOTE — ED Triage Notes (Signed)
Pt reports had tick bite to left groin about 6 weeks ago and reports noticed at same site now has"infection" x3 days. Pt denies any known fevers,nausea.

## 2023-07-15 NOTE — ED Provider Notes (Signed)
[facility]   [id number] 07/15/23 Arrival Time: 0801  ASSESSMENT & PLAN:  1. Localized bacterial skin infection   2. Elevated blood pressure reading without diagnosis of hypertension    Declines I&D attempt at this time. Suspect he will need but prefers trial of antibiotic first.   Begin: Meds ordered this encounter  Medications   doxycycline (VIBRAMYCIN) 100 MG capsule    Sig: Take 1 capsule (100 mg total) by mouth 2 (two) times daily.    Dispense:  14 capsule    Refill:  0     Discharge Instructions      Your blood pressure was noted to be elevated during your visit today. If you are currently taking medication for high blood pressure, please ensure you are taking this as directed. If you do not have a history of high blood pressure and your blood pressure remains persistently elevated, you may need to begin taking a medication at some point. You may return here within the next few days to recheck if unable to see your primary care provider or if you do not have a one.  BP (!) 162/98 (BP Location: Right Arm)   Pulse 67   Temp 98.1 F (36.7 C) (Oral)   Resp 20   SpO2 98%   BP Readings from Last 3 Encounters:  07/15/23 (!) 162/98  05/25/23 (!) 141/100  04/02/22 (!) 164/97         Reviewed expectations re: course of current medical issues. Questions answered. Outlined signs and symptoms indicating need for more acute intervention. Patient verbalized understanding. After Visit Summary given.   SUBJECTIVE:  Wayne Wells is a 57 y.o. male who presents with a possible infection of his L groin. Pt reports had tick bite to left groin about 6 weeks ago and reports noticed at same site now has"infection" x3 days. Pt denies any known fevers,nausea.  Increased blood pressure noted today. Reports that he has not been tx for HTN. No symptoms.    OBJECTIVE:  Vitals:   07/15/23 0818  BP: (!) 162/98  Pulse: 67  Resp: 20  Temp: 98.1 F (36.7 C)   TempSrc: Oral  SpO2: 98%     General appearance: alert; no distress L groin: approx 0.5 cm induration of his L superior groin area with surrounding erythema; warm and tender to touch; no active drainage or bleeding; center of area with mild fluctuance Psychological: alert and cooperative; normal mood and affect  No Known Allergies  Past Medical History:  Diagnosis Date   Hypothyroidism    Social History   Socioeconomic History   Marital status: Married    Spouse name: Not on file   Number of children: Not on file   Years of education: Not on file   Highest education level: Not on file  Occupational History   Not on file  Tobacco Use   Smoking status: Every Day    Current packs/day: 1.00    Types: Cigarettes   Smokeless tobacco: Never  Vaping Use   Vaping status: Never Used  Substance and Sexual Activity   Alcohol use: Yes   Drug use: Never   Sexual activity: Yes  Other Topics Concern   Not on file  Social History Narrative   Not on file   Social Determinants of Health   Financial Resource Strain: Low Risk  (03/17/2022)   Received from [facility], [facility]   Overall Financial Resource Strain (CARDIA)    Difficulty of Paying Living Expenses:  Not hard at all  Food Insecurity: No Food Insecurity (03/17/2022)   Received from [facility], [facility]   Hunger Vital Sign    Worried About Running Out of Food in the Last Year: Never true    Ran Out of Food in the Last Year: Never true  Transportation Needs: Not on file  Physical Activity: Sufficiently Active (03/17/2022)   Received from [facility]   Exercise Vital Sign    Days of Exercise per Week: 6 days    Minutes of Exercise per Session: 40 min  Stress: Stress Concern Present (03/17/2022)   Received from [facility], [facility] - Occupational Stress Questionnaire    Feeling of Stress : To some extent  Social Connections: Unknown  (03/22/2023)   Received from [facility], [facility]   Social Network    Social Network: Not on file   History reviewed. No pertinent family history. History reviewed. No pertinent surgical history.          Mardella Layman, MD 07/15/23 [phone]

## 2023-09-16 ENCOUNTER — Ambulatory Visit
Admission: EM | Admit: 2023-09-16 | Discharge: 2023-09-16 | Disposition: A | Discharge status: Home / Self Care | Payer: BC Managed Care – PPO | Attending: Family Medicine | Admitting: Family Medicine

## 2023-09-16 DIAGNOSIS — J302 Other seasonal allergic rhinitis: Secondary | ICD-10-CM

## 2023-09-16 DIAGNOSIS — J22 Unspecified acute lower respiratory infection: Secondary | ICD-10-CM | POA: Diagnosis not present

## 2023-09-16 MED ORDER — ALBUTEROL SULFATE HFA 108 (90 BASE) MCG/ACT IN AERS: 2.0000 | INHALATION_SPRAY | RESPIRATORY_TRACT | 0 refills | Status: AC | PRN

## 2023-09-16 MED ORDER — CETIRIZINE HCL 10 MG PO TABS: 10.0000 mg | ORAL_TABLET | Freq: Every day | ORAL | 2 refills | Status: AC

## 2023-09-16 MED ORDER — FLUTICASONE PROPIONATE 50 MCG/ACT NA SUSP: 1.0000 | Freq: Two times a day (BID) | NASAL | 2 refills | Status: AC

## 2023-09-16 MED ORDER — AZITHROMYCIN 250 MG PO TABS: ORAL_TABLET | ORAL | 0 refills | Status: AC

## 2023-09-16 NOTE — ED Triage Notes (Signed)
Pt states cough for the past few weeks. States his chest hurts when he coughs.  Pt also c/o neck stiffness/pain for the past few weeks as well. States he has been taking mucinex at home.

## 2023-09-16 NOTE — ED Provider Notes (Signed)
[facility]    CSN: [id number] Arrival date & time: 09/16/23  0801      History   Chief Complaint Chief Complaint  Patient presents with   Cough    HPI Wayne Wells is a 57 y.o. male.   Patient presenting today with several week history of chest tightness, wheezing, dry cough, nasal congestion.  Denies fever, chills, chest pain, significant shortness of breath, abdominal pain, nausea vomiting or diarrhea.  Trying Mucinex at home with mild temporary benefit.  Symptoms seem to be worse at night.  Denies known history of seasonal allergies or asthma, COPD.  Is a current cigarette smoker.    Past Medical History:  Diagnosis Date   Hypothyroidism     There are no problems to display for this patient.   History reviewed. No pertinent surgical history.     Home Medications    Prior to Admission medications   Medication Sig Start Date End Date Taking? Authorizing Provider  albuterol (VENTOLIN HFA) 108 (90 Base) MCG/ACT inhaler Inhale 2 puffs into the lungs every 4 (four) hours as needed. 09/16/23  Yes Particia Nearing, PA-C  azithromycin (ZITHROMAX) 250 MG tablet Take first 2 tablets together, then 1 every day until finished. 09/16/23  Yes Particia Nearing, PA-C  cetirizine (ZYRTEC ALLERGY) 10 MG tablet Take 1 tablet (10 mg total) by mouth daily. 09/16/23  Yes Particia Nearing, PA-C  fluticasone [facility]) 50 MCG/ACT nasal spray Place 1 spray into both nostrils 2 (two) times daily. 09/16/23  Yes Particia Nearing, PA-C  doxycycline (VIBRAMYCIN) 100 MG capsule Take 1 capsule (100 mg total) by mouth 2 (two) times daily. 07/15/23   Mardella Layman, MD  levothyroxine (SYNTHROID) 50 MCG tablet Take 50 mcg by mouth daily before breakfast. 05/04/20   [provider]    Family History History reviewed. No pertinent family history.  Social History Social History   Tobacco Use   Smoking status: Every Day    Current packs/day: 1.00     Types: Cigarettes   Smokeless tobacco: Never  Vaping Use   Vaping status: Never Used  Substance Use Topics   Alcohol use: Yes   Drug use: Never     Allergies   Patient has no known allergies.   Review of Systems Review of Systems Per HPI  Physical Exam Triage Vital Signs ED Triage Vitals  Encounter Vitals Group     BP 09/16/23 0825 (!) 167/99     Systolic BP Percentile --      Diastolic BP Percentile --      Pulse Rate 09/16/23 0825 (!) 104     Resp 09/16/23 0825 16     Temp 09/16/23 0825 97.9 F (36.6 C)     Temp Source 09/16/23 0825 Oral     SpO2 09/16/23 0825 97 %     Weight --      Height --      Head Circumference --      Peak Flow --      Pain Score 09/16/23 0826 5     Pain Loc --      Pain Education --      Exclude from Growth Chart --    No data found.  Updated Vital Signs BP (!) 167/99 (BP Location: Right Arm)   Pulse 91   Temp 97.9 F (36.6 C) (Oral)   Resp 16   SpO2 97%   Visual Acuity Right Eye Distance:   Left Eye Distance:  Bilateral Distance:    Right Eye Near:   Left Eye Near:    Bilateral Near:     Physical Exam Vitals and nursing note reviewed.  Constitutional:      Appearance: He is well-developed.  HENT:     Head: Atraumatic.     Right Ear: External ear normal.     Left Ear: External ear normal.     Nose:     Comments: Nasal mucosa boggy and erythematous bilaterally    Mouth/Throat:     Pharynx: Posterior oropharyngeal erythema present. No oropharyngeal exudate.  Eyes:     Conjunctiva/sclera: Conjunctivae normal.     Pupils: Pupils are equal, round, and reactive to light.  Cardiovascular:     Rate and Rhythm: Normal rate and regular rhythm.  Pulmonary:     Effort: Pulmonary effort is normal. No respiratory distress.     Breath sounds: No wheezing or rales.  Musculoskeletal:        General: Normal range of motion.     Cervical back: Normal range of motion and neck supple.  Lymphadenopathy:     Cervical: No cervical  adenopathy.  Skin:    General: Skin is warm and dry.  Neurological:     Mental Status: He is alert and oriented to person, place, and time.  Psychiatric:        Behavior: Behavior normal.      UC Treatments / Results  Labs (all labs ordered are listed, but only abnormal results are displayed) Labs Reviewed - No data to display  EKG   Radiology No results found.  Procedures Procedures (including critical care time)  Medications Ordered in UC Medications - No data to display  Initial Impression / Assessment and Plan / UC Course  I have reviewed the triage vital signs and the nursing notes.  Pertinent labs & imaging results that were available during my care of the patient were reviewed by me and considered in my medical decision making (see chart for details).     Hypertensive in triage, otherwise vital signs reassuring.  Suspect lower respiratory infection given duration of symptoms will cover with Zithromax, albuterol inhaler as needed for wheezing and chest tightness and Mucinex and other supportive medications.  Do also suspect some underlying seasonal allergies causing prolonging course, treat with Zyrtec and Flonase regimen daily.  Return for worsening symptoms.  Final Clinical Impressions(s) / UC Diagnoses   Final diagnoses:  Lower respiratory infection  Seasonal allergic rhinitis, unspecified trigger   Discharge Instructions   None    ED Prescriptions     Medication Sig Dispense Auth. Provider   azithromycin (ZITHROMAX) 250 MG tablet Take first 2 tablets together, then 1 every day until finished. 6 tablet Particia Nearing, [place]   cetirizine (ZYRTEC ALLERGY) 10 MG tablet Take 1 tablet (10 mg total) by mouth daily. 30 tablet Particia Nearing, PA-C   fluticasone [facility]) 50 MCG/ACT nasal spray Place 1 spray into both nostrils 2 (two) times daily. 16 g Particia Nearing, [place]   albuterol (VENTOLIN HFA) 108 (90 Base) MCG/ACT inhaler Inhale 2  puffs into the lungs every 4 (four) hours as needed. 18 g Particia Nearing, [place]      PDMP not reviewed this encounter.   Roosvelt Maser [place], [place] 09/16/23 [phone]

## 2023-09-22 ENCOUNTER — Emergency Department (HOSPITAL_COMMUNITY): Payer: BC Managed Care – PPO

## 2023-09-22 ENCOUNTER — Encounter (HOSPITAL_COMMUNITY): Payer: Self-pay

## 2023-09-22 ENCOUNTER — Emergency Department (HOSPITAL_COMMUNITY)
Admission: EM | Admit: 2023-09-22 | Discharge: 2023-09-23 | Disposition: A | Discharge status: Home / Self Care | Payer: BC Managed Care – PPO | Attending: Emergency Medicine | Admitting: Emergency Medicine

## 2023-09-22 ENCOUNTER — Other Ambulatory Visit: Payer: Self-pay

## 2023-09-22 DIAGNOSIS — Z79899 Other long term (current) drug therapy: Secondary | ICD-10-CM | POA: Diagnosis not present

## 2023-09-22 DIAGNOSIS — R0789 Other chest pain: Secondary | ICD-10-CM | POA: Insufficient documentation

## 2023-09-22 DIAGNOSIS — R079 Chest pain, unspecified: Secondary | ICD-10-CM | POA: Diagnosis present

## 2023-09-22 DIAGNOSIS — Z20822 Contact with and (suspected) exposure to covid-19: Secondary | ICD-10-CM | POA: Diagnosis not present

## 2023-09-22 DIAGNOSIS — R0602 Shortness of breath: Secondary | ICD-10-CM | POA: Diagnosis not present

## 2023-09-22 DIAGNOSIS — E039 Hypothyroidism, unspecified: Secondary | ICD-10-CM | POA: Insufficient documentation

## 2023-09-22 LAB — CBC
HCT: 43.1 % (ref 39.0–52.0)
Hemoglobin: 15 g/dL (ref 13.0–17.0)
MCH: 31.1 pg (ref 26.0–34.0)
MCHC: 34.8 g/dL (ref 30.0–36.0)
MCV: 89.4 fL (ref 80.0–100.0)
Platelets: 195 10*3/uL (ref 150–400)
RBC: 4.82 MIL/uL (ref 4.22–5.81)
RDW: 12.7 % (ref 11.5–15.5)
WBC: 6.1 10*3/uL (ref 4.0–10.5)
nRBC: 0 % (ref 0.0–0.2)

## 2023-09-22 LAB — COMPREHENSIVE METABOLIC PANEL
ALT: 20 U/L (ref 0–44)
AST: 18 U/L (ref 15–41)
Albumin: 3.8 g/dL (ref 3.5–5.0)
Alkaline Phosphatase: 64 U/L (ref 38–126)
Anion gap: 9 (ref 5–15)
BUN: 18 mg/dL (ref 6–20)
CO2: 25 mmol/L (ref 22–32)
Calcium: 8.8 mg/dL — ABNORMAL LOW (ref 8.9–10.3)
Chloride: 104 mmol/L (ref 98–111)
Creatinine, Ser: 0.95 mg/dL (ref 0.61–1.24)
GFR, Estimated: >60 mL/min (ref >60)
Glucose, Bld: 125 mg/dL — ABNORMAL HIGH (ref 70–99)
Potassium: 3.3 mmol/L — ABNORMAL LOW (ref 3.5–5.1)
Sodium: 138 mmol/L (ref 135–145)
Total Bilirubin: 0.8 mg/dL (ref 0.3–1.2)
Total Protein: 6.6 g/dL (ref 6.5–8.1)

## 2023-09-22 LAB — BRAIN NATRIURETIC PEPTIDE: B Natriuretic Peptide: 12.2 pg/mL (ref 0.0–100.0)

## 2023-09-22 LAB — LIPASE, BLOOD: Lipase: 42 U/L (ref 11–51)

## 2023-09-22 LAB — RESP PANEL BY RT-PCR (RSV, FLU A&B, COVID)  RVPGX2
Influenza A by PCR: NEGATIVE
Influenza B by PCR: NEGATIVE
Resp Syncytial Virus by PCR: NEGATIVE
SARS Coronavirus 2 by RT PCR: NEGATIVE

## 2023-09-22 LAB — D-DIMER, QUANTITATIVE: D-Dimer, Quant: 0.52 ug{FEU}/mL — ABNORMAL HIGH (ref 0.00–0.50)

## 2023-09-22 LAB — TROPONIN I (HIGH SENSITIVITY): Troponin I (High Sensitivity): 4 ng/L (ref <18)

## 2023-09-22 MED ORDER — ACETAMINOPHEN 500 MG PO TABS
1000.0000 mg | ORAL_TABLET | Freq: Once | ORAL | Status: AC
  Administered 2023-09-22: 1000 mg via ORAL
  Filled 2023-09-22: qty 2

## 2023-09-22 MED ORDER — KETOROLAC TROMETHAMINE 30 MG/ML IJ SOLN: 30.0000 mg | Freq: Once | INTRAMUSCULAR | Status: DC

## 2023-09-22 NOTE — ED Provider Notes (Signed)
[facility] Provider Note   CSN: [id number] Arrival date & time: 09/22/23  2040     History {Add pertinent medical, surgical, social history, OB history to HPI:1} Chief Complaint  Patient presents with  . Chest Pain    Wayne Wells is a 57 y.o. male with PMH as listed below who presents with lower bilateral chest pain, R>L. Started 6 weeks ago with flu-like symptoms, had cough, and then chest started hurting. Cough has improved. No hemoptysis, f/c, SOB, nausea/vomiting. Hurts worse when breathes deeply and bends over. No improvement with sitting up vs lying down. Endorses neck stiffness as well with rotational movement. Also w/ generalized fatigue/malaise over a few weeks. No h/o similar. No recent travel, surgeries, hospitalizations. No h/o DVT/PE. No h/o cardiac disease. No pain at rest but pain 3/10 when breathing in.  Past Medical History:  Diagnosis Date  . Hypothyroidism        Home Medications Prior to Admission medications   Medication Sig Start Date End Date Taking? Authorizing Provider  albuterol (VENTOLIN HFA) 108 (90 Base) MCG/ACT inhaler Inhale 2 puffs into the lungs every 4 (four) hours as needed. 09/16/23   Particia Nearing, PA-C  azithromycin (ZITHROMAX) 250 MG tablet Take first 2 tablets together, then 1 every day until finished. 09/16/23   Particia Nearing, PA-C  cetirizine (ZYRTEC ALLERGY) 10 MG tablet Take 1 tablet (10 mg total) by mouth daily. 09/16/23   Particia Nearing, PA-C  doxycycline (VIBRAMYCIN) 100 MG capsule Take 1 capsule (100 mg total) by mouth 2 (two) times daily. 07/15/23   Mardella Layman, MD  fluticasone (FLONASE) 50 MCG/ACT nasal spray Place 1 spray into both nostrils 2 (two) times daily. 09/16/23   Particia Nearing, PA-C  levothyroxine (SYNTHROID) 50 MCG tablet Take 50 mcg by mouth daily before breakfast. 05/04/20   [provider]      Allergies    Patient has no  known allergies.    Review of Systems   Review of Systems A 10 point review of systems was performed and is negative unless otherwise reported in HPI.  Physical Exam Updated Vital Signs BP (!) 168/91 (BP Location: Right Arm)   Pulse (!) 108   Temp 98.2 F (36.8 C) (Oral)   Resp 16   SpO2 96%  Physical Exam General: Normal appearing male, lying in bed.  HEENT: PERRLA, Sclera anicteric, MMM, trachea midline.  Cardiology: RRR, no murmurs/rubs/gallops. BL radial and DP pulses equal bilaterally.  Resp: Normal respiratory rate and effort. CTAB, no wheezes, rhonchi, crackles.  Abd: Soft, non-tender, non-distended. No rebound tenderness or guarding.  GU: Deferred. MSK: No peripheral edema or signs of trauma. Extremities without deformity or TTP. No cyanosis or clubbing. Skin: warm, dry. No rashes or lesions. Back: No CVA tenderness Neuro: A&Ox4, CNs II-XII grossly intact. MAEs. Sensation grossly intact.  Psych: Normal mood and affect.   ED Results / Procedures / Treatments   Labs (all labs ordered are listed, but only abnormal results are displayed) Labs Reviewed  BASIC METABOLIC PANEL  CBC  TROPONIN I (HIGH SENSITIVITY)    EKG None  Radiology No results found.  Procedures Procedures  {Document cardiac monitor, telemetry assessment procedure when appropriate:1}  Medications Ordered in ED Medications - No data to display  ED Course/ Medical Decision Making/ A&P  Medical Decision Making Amount and/or Complexity of Data Reviewed Labs: ordered. Decision-making details documented in ED Course. Radiology: ordered. Decision-making details documented in ED Course.  Risk OTC drugs.    This patient presents to the ED for concern of CP, this involves an extensive number of treatment options, and is a complaint that carries with it a high risk of complications and morbidity.  I considered the following differential and admission for this acute,  potentially life threatening condition.   MDM:    DDX for chest pain includes but is not limited to:  ACS/arrhythmia,  PE, aortic dissection, PNA, PTX, esophogeal rupture, biliary disease, GERD/PUD/gastritis, or musculoskeletal pain. Very low suspicion for ACS vs aortic dissection given presenting sx. EKG w/ no signs of ischemia, and troponin x1 is negative. Patient cannot PERC out based on age, but will obtain D dimer and reassess, minimal risk factors for PE.  Considered pericarditis given report of flu-like symptoms, but EKG w/ no diffuse elevations, and BSUS with no pericardial effusion. Considered biliary disease given report of R lower chest hurting worse than left, but LFTs are normal, patient is afebrile, and negative murphy's sign. Considered pancreatitis but lipase negative***.    Clinical Course as of 09/30/23 0800  Tue Sep 22, 2023  2258 CBC, CMP unremarkable in context of presentation. Troponin negative, sufficient given 6 weeks of sxs.  [HN]  2332 B Natriuretic Peptide: 12.2 Neg, lipase neg as well. Resp panel neg.  [HN]  2333 BSUS demonstrates good global squeeze, normal RV and LV size, no pericardial effusion [HN]  2341 D-Dimer, Quant(!): 0.52 Neg age-adjusted D-dimer [HN]  2356 DG Chest 2 View Linear atelectasis in both lung bases. [HN]    Clinical Course User Index [HN] Loetta Rough, MD    Labs: I Ordered, and personally interpreted labs.  The pertinent results include:  those listed above  Imaging Studies ordered: CXR ordered from triage I independently visualized and interpreted imaging. I agree with the radiologist interpretation  Additional history obtained from chart review.    Cardiac Monitoring: .The patient was maintained on a cardiac monitor.  I personally viewed and interpreted the cardiac monitored which showed an underlying rhythm of: NSR  Reevaluation: After the interventions noted above, I reevaluated the patient and found that they have  :{resolved/improved/worsened:23923::"improved"}  Social Determinants of Health: Marland Kitchen Patient lives independently  Disposition:  ***  Co morbidities that complicate the patient evaluation . Past Medical History:  Diagnosis Date  . Hypothyroidism      Medicines No orders of the defined types were placed in this encounter.   I have reviewed the patients home medicines and have made adjustments as needed  Problem List / ED Course: Problem List Items Addressed This Visit   None        {Document critical care time when appropriate:1} {Document review of labs and clinical decision tools ie heart score, Chads2Vasc2 etc:1}  {Document your independent review of radiology images, and any outside records:1} {Document your discussion with family members, caretakers, and with consultants:1} {Document social determinants of health affecting pt's care:1} {Document your decision making why or why not admission, treatments were needed:1}  This note was created using dictation software, which may contain spelling or grammatical errors.

## 2023-09-22 NOTE — ED Triage Notes (Signed)
Pt presents via POV c/o chest pain and SOB x6 weeks. Reports chest pain with coughing. Reports treated with OP abx without resolution. Reports neck stiffness, denies headache. Reports feeling generalized fatigue and malaise over the last few weeks. Denies having previous CXR.

## 2023-09-23 NOTE — Discharge Instructions (Addendum)
Thank you for coming to [facility]. You were seen for chest pain. We did an exam, labs, and imaging, and these showed no acute findings.  Please follow up with your primary care provider within 1-2 weeks for continued workup and management. You can alternate taking Tylenol and ibuprofen as needed for pain. You can take 650mg  tylenol (acetaminophen) every 4-6 hours, and 600 mg ibuprofen 3 times a day.   Do not hesitate to return to the ED or call 911 if you experience: -Worsening symptoms -Lightheadedness, passing out -Fevers/chills -Anything else that concerns you

## 2024-01-11 ENCOUNTER — Other Ambulatory Visit: Payer: Self-pay | Admitting: Family Medicine
